# Patient Record
Sex: Female | Born: 1990 | Race: Black or African American | Hispanic: No | Marital: Single | Smoking: Never smoker
Health system: Southern US, Community
[De-identification: ages and names within clinical notes are randomized; demographics above are authoritative.]

## PROBLEM LIST (undated history)

## (undated) DIAGNOSIS — K3184 Gastroparesis: Secondary | ICD-10-CM

## (undated) DIAGNOSIS — R51 Headache: Secondary | ICD-10-CM

## (undated) DIAGNOSIS — K589 Irritable bowel syndrome without diarrhea: Secondary | ICD-10-CM

## (undated) DIAGNOSIS — R519 Headache, unspecified: Secondary | ICD-10-CM

## (undated) DIAGNOSIS — K219 Gastro-esophageal reflux disease without esophagitis: Secondary | ICD-10-CM

## (undated) DIAGNOSIS — F909 Attention-deficit hyperactivity disorder, unspecified type: Secondary | ICD-10-CM

## (undated) HISTORY — PX: MOUTH SURGERY: SHX715

## (undated) HISTORY — DX: Headache: R51

## (undated) HISTORY — DX: Headache, unspecified: R51.9

## (undated) HISTORY — DX: Irritable bowel syndrome, unspecified: K58.9

## (undated) HISTORY — DX: Gastro-esophageal reflux disease without esophagitis: K21.9

## (undated) HISTORY — DX: Gastroparesis: K31.84

## (undated) HISTORY — DX: Attention-deficit hyperactivity disorder, unspecified type: F90.9

---
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
## 2012-01-01 ENCOUNTER — Telehealth: Payer: Self-pay | Admitting: *Deleted

## 2012-01-01 NOTE — Telephone Encounter (Signed)
Results route to Sarah Lan, NP per Dr Jarold Motto.

## 2012-01-01 NOTE — Telephone Encounter (Signed)
Message copied by Florene Glen on Wed Jan 01, 2012  1:56 PM ------      Message from: Jarold Motto, DAVID R      Created: Mon Dec 30, 2011 11:37 AM       Mistake per ca 19-9...wrong test

## 2012-01-03 ENCOUNTER — Ambulatory Visit (AMBULATORY_SURGERY_CENTER): Payer: Medicaid Other | Admitting: Gastroenterology

## 2012-01-03 ENCOUNTER — Encounter: Payer: Self-pay | Admitting: Gastroenterology

## 2012-01-03 VITALS — BP 114/71 | HR 85 | Temp 97.5°F | Resp 20 | Ht 64.0 in | Wt 90.0 lb

## 2012-01-03 DIAGNOSIS — K210 Gastro-esophageal reflux disease with esophagitis: Secondary | ICD-10-CM

## 2012-01-03 DIAGNOSIS — D1330 Benign neoplasm of unspecified part of small intestine: Secondary | ICD-10-CM

## 2012-01-03 DIAGNOSIS — R1013 Epigastric pain: Secondary | ICD-10-CM

## 2012-01-03 MED ORDER — SODIUM CHLORIDE 0.9 % IV SOLN: 500.0000 mL | INTRAVENOUS | Status: DC

## 2012-01-03 NOTE — Patient Instructions (Addendum)
Discharge instructions given with verbal understanding. Biopsies taken. Resume previous medications. Office will arrange for a Gastric Emptying Scan. YOU HAD AN ENDOSCOPIC PROCEDURE TODAY AT THE [facility]: Refer to the procedure report that was given to you for any specific questions about what was found during the examination.  If the procedure report does not answer your questions, please call your gastroenterologist to clarify.  If you requested that your care partner not be given the details of your procedure findings, then the procedure report has been included in a sealed envelope for you to review at your convenience later.  YOU SHOULD EXPECT: Some feelings of bloating in the abdomen. Passage of more gas than usual.  Walking can help get rid of the air that was put into your GI tract during the procedure and reduce the bloating. If you had a lower endoscopy (such as a colonoscopy or flexible sigmoidoscopy) you may notice spotting of blood in your stool or on the toilet paper. If you underwent a bowel prep for your procedure, then you may not have a normal bowel movement for a few days.  DIET: Your first meal following the procedure should be a light meal and then it is ok to progress to your normal diet.  A half-sandwich or bowl of soup is an example of a good first meal.  Heavy or fried foods are harder to digest and may make you feel nauseous or bloated.  Likewise meals heavy in dairy and vegetables can cause extra gas to form and this can also increase the bloating.  Drink plenty of fluids but you should avoid alcoholic beverages for 24 hours.  ACTIVITY: Your care partner should take you home directly after the procedure.  You should plan to take it easy, moving slowly for the rest of the day.  You can resume normal activity the day after the procedure however you should NOT DRIVE or use heavy machinery for 24 hours (because of the sedation medicines used during the test).     SYMPTOMS TO REPORT IMMEDIATELY: A gastroenterologist can be reached at any hour.  During normal business hours, 8:30 AM to 5:00 PM Monday through Friday, call [phone].  After hours and on weekends, please call the GI answering service at [phone] who will take a message and have the physician on call contact you.   Following lower endoscopy (colonoscopy or flexible sigmoidoscopy):  Excessive amounts of blood in the stool  Significant tenderness or worsening of abdominal pains  Swelling of the abdomen that is new, acute  Fever of 100F or higher  FOLLOW UP: If any biopsies were taken you will be contacted by phone or by letter within the next 1-3 weeks.  Call your gastroenterologist if you have not heard about the biopsies in 3 weeks.  Our staff will call the home number listed on your records the next business day following your procedure to check on you and address any questions or concerns that you may have at that time regarding the information given to you following your procedure. This is a courtesy call and so if there is no answer at the home number and we have not heard from you through the emergency physician on call, we will assume that you have returned to your regular daily activities without incident.  SIGNATURES/CONFIDENTIALITY: You and/or your care partner have signed paperwork which will be entered into your electronic medical record.  These signatures attest to the fact that that the information above  on your After Visit Summary has been reviewed and is understood.  Full responsibility of the confidentiality of this discharge information lies with you and/or your care-partner.

## 2012-01-03 NOTE — Op Note (Signed)
[facility] [address] [place], [address]   ENDOSCOPY PROCEDURE REPORT  PATIENT: Sarah [name], Sarah [name]  MR#: [id number] BIRTHDATE: 09-05-1990 , 20  yrs. old GENDER: Female ENDOSCOPIST:David Hale Bogus, MD, [facility] REFERRED BY: PROCEDURE DATE:  01/03/2012 PROCEDURE:   EGD w/ biopsy and EGD w/ biopsy for H.pylori ASA CLASS:    Class I INDICATIONS: epigastric pain. MEDICATION: propofol (Diprivan) 200mg  IV TOPICAL ANESTHETIC:   Cetacaine Spray  DESCRIPTION OF PROCEDURE:   After the risks and benefits of the procedure were explained, informed consent was obtained.  The [facility] GIF-H180 [id number]  endoscope was introduced through the mouth  and advanced to the second portion of the duodenum .  The instrument was slowly withdrawn as the mucosa was fully examined.    The upper, middle and distal third of the esophagus were carefully inspected and no abnormalities were noted.  The z-line was well seen at the GEJ.  The endoscope was pushed into the fundus which was normal including a retroflexed view.  The antrum, gastric body, first and second part of the duodenum were unremarkable.   Duodenal and esophageal biopsies done.Noted is small FB and focal gastritis in body...see pictures.   Duodenal and esophageal biopsies done.Noted is small FB and focal gastritis in body...see pictures. Retroflexed views revealed no abnormalities.    The scope was then withdrawn from the patient and the procedure completed.  COMPLICATIONS: There were no complications.   ENDOSCOPIC IMPRESSION: 1.   Normal EGD 2.   Duodenal and esophageal biopsies done.Noted is small FB and focal gastritis in body...see pictures. 3.    ?? gastroparesis  RECOMMENDATIONS: 1.  Await pathology results 2.  My office will arrange for you to have a Gastric Emptying Scan performed.  This is a radiology test that gives an idea of how well your stomach functions.    _______________________________ eSignedMardella Layman, MD, [facility] 01/03/2012 3:47 PM      PATIENT NAME:  Sarah [name], Sarah [name] MR#: [id number]

## 2012-01-07 ENCOUNTER — Telehealth: Payer: Self-pay | Admitting: *Deleted

## 2012-01-07 NOTE — Telephone Encounter (Signed)
  Follow up Call-  Call back number 01/03/2012  Post procedure Call Back phone  # [phone]  Permission to leave phone message Yes     Patient questions:  Do you have a fever, pain , or abdominal swelling? no Pain Score  0 *  Have you tolerated food without any problems? yes  Have you been able to return to your normal activities? yes  Do you have any questions about your discharge instructions: Diet   no Medications  no Follow up visit  no  Do you have questions or concerns about your Care? no  Actions: * If pain score is 4 or above: No action needed, pain <4.

## 2012-01-10 ENCOUNTER — Telehealth: Payer: Self-pay | Admitting: *Deleted

## 2012-01-10 ENCOUNTER — Encounter: Payer: Self-pay | Admitting: Gastroenterology

## 2012-01-10 DIAGNOSIS — K3189 Other diseases of stomach and duodenum: Secondary | ICD-10-CM

## 2012-01-10 DIAGNOSIS — R1013 Epigastric pain: Secondary | ICD-10-CM

## 2012-01-10 NOTE — Telephone Encounter (Signed)
Pt informed of GES at [facility] on 01/21/12 at 0800am, arrive at 0745; NPO after midnight and she must stop her stomach meds 24 hours prior to the procedure. Pt stated understanding.

## 2012-01-21 ENCOUNTER — Encounter (HOSPITAL_COMMUNITY)
Admission: RE | Admit: 2012-01-21 | Discharge: 2012-01-21 | Disposition: A | Discharge status: Home / Self Care | Payer: Medicaid Other | Source: Ambulatory Visit | Attending: Gastroenterology | Admitting: Gastroenterology

## 2012-01-21 ENCOUNTER — Encounter (HOSPITAL_COMMUNITY): Payer: Self-pay

## 2012-01-21 DIAGNOSIS — R1013 Epigastric pain: Secondary | ICD-10-CM | POA: Insufficient documentation

## 2012-01-21 DIAGNOSIS — K3189 Other diseases of stomach and duodenum: Secondary | ICD-10-CM

## 2012-01-21 MED ORDER — TECHNETIUM TC 99M SULFUR COLLOID
2.2000 | Freq: Once | INTRAVENOUS | Status: AC | PRN
  Administered 2012-01-21: 2.2 via ORAL

## 2012-01-24 ENCOUNTER — Other Ambulatory Visit: Payer: Self-pay | Admitting: *Deleted

## 2012-01-24 MED ORDER — AMBULATORY NON FORMULARY MEDICATION: Status: DC

## 2012-01-31 ENCOUNTER — Telehealth: Payer: Self-pay | Admitting: Gastroenterology

## 2012-01-31 NOTE — Telephone Encounter (Signed)
I answered patient's questions about obtaining Domperidone at pharmacy in [place].

## 2012-03-05 ENCOUNTER — Encounter: Payer: Self-pay | Admitting: *Deleted

## 2012-03-06 ENCOUNTER — Ambulatory Visit: Payer: Self-pay

## 2012-03-10 ENCOUNTER — Ambulatory Visit: Payer: Medicaid Other | Admitting: Gastroenterology

## 2012-03-12 ENCOUNTER — Ambulatory Visit: Payer: Self-pay | Admitting: Family

## 2012-03-17 ENCOUNTER — Telehealth: Payer: Self-pay | Admitting: *Deleted

## 2012-03-17 NOTE — Telephone Encounter (Signed)
lmom for pt to call back

## 2012-03-17 NOTE — Telephone Encounter (Signed)
Message copied by Florene Glen on Tue Mar 17, 2012  3:37 PM ------      Message from: Florene Glen      Created: Fri Jan 24, 2012 10:49 AM       Needs f/u with dr Jarold Motto after beginning domperidone and gastroparesis diet

## 2012-03-27 ENCOUNTER — Ambulatory Visit: Payer: Self-pay | Admitting: Obstetrics & Gynecology

## 2012-04-09 ENCOUNTER — Ambulatory Visit (INDEPENDENT_AMBULATORY_CARE_PROVIDER_SITE_OTHER): Payer: Self-pay | Admitting: Obstetrics & Gynecology

## 2012-04-09 ENCOUNTER — Encounter: Payer: Self-pay | Admitting: Obstetrics & Gynecology

## 2012-04-09 VITALS — BP 102/73 | HR 102 | Temp 97.8°F | Resp 12 | Ht 63.0 in | Wt 97.9 lb

## 2012-04-09 DIAGNOSIS — Z3042 Encounter for surveillance of injectable contraceptive: Secondary | ICD-10-CM

## 2012-04-09 DIAGNOSIS — Z3049 Encounter for surveillance of other contraceptives: Secondary | ICD-10-CM

## 2012-04-09 MED ORDER — MEDROXYPROGESTERONE ACETATE 150 MG/ML IM SUSP
150.0000 mg | INTRAMUSCULAR | Status: DC
  Administered 2012-04-09: 150 mg via INTRAMUSCULAR

## 2012-04-09 NOTE — Patient Instructions (Signed)
Human Papillomavirus Vaccine, Quadrivalent What is this medicine? HUMAN PAPILLOMAVIRUS VACCINE (HYOO muhn pap uh LOH muh vahy ruhs vak SEEN) is a vaccine. It is used to prevent infections of four types of the human papillomavirus. In women, the vaccine may lower your risk of getting cervical, vaginal, or anal cancer and genital warts. In men, the vaccine may lower your risk of getting genital warts and anal cancer. You cannot get these diseases from the vaccine. This vaccine does not treat these diseases. This medicine may be used for other purposes; ask your health care provider or pharmacist if you have questions. What should I tell my health care provider before I take this medicine? They need to know if you have any of these conditions: -fever or infection -hemophilia -HIV infection or AIDS -immune system problems -low platelet count -an unusual reaction to Human Papillomavirus Vaccine, yeast, other medicines, foods, dyes, or preservatives -pregnant or trying to get pregnant -breast-feeding How should I use this medicine? This vaccine is for injection in a muscle on your upper arm or thigh. It is given by a health care professional. You will be observed for 15 minutes after each dose. Sometimes, fainting happens after the vaccine is given. You may be asked to sit or lie down during the 15 minutes. Three doses are given. The second dose is given 2 months after the first dose. The last dose is given 4 months after the second dose. A copy of a Vaccine Information Statement will be given before each vaccination. Read this sheet carefully each time. The sheet may change frequently. Talk to your pediatrician regarding the use of this medicine in children. While this drug may be prescribed for children as young as 9 years of age for selected conditions, precautions do apply. Overdosage: If you think you have taken too much of this medicine contact a poison control center or emergency room at  once. NOTE: This medicine is only for you. Do not share this medicine with others. What if I miss a dose? All 3 doses of the vaccine should be given within 6 months. Remember to keep appointments for follow-up doses. Your health care provider will tell you when to return for the next vaccine. Ask your health care professional for advice if you are unable to keep an appointment or miss a scheduled dose. What may interact with this medicine? -medicines that suppress your immune system like some medicines for cancer -steroid medicines like prednisone or cortisone -other vaccines This list may not describe all possible interactions. Give your health care provider a list of all the medicines, herbs, non-prescription drugs, or dietary supplements you use. Also tell them if you smoke, drink alcohol, or use illegal drugs. Some items may interact with your medicine. What should I watch for while using this medicine? This vaccine may not fully protect everyone. Continue to have regular pelvic exams and cervical or anal cancer screenings as directed by your doctor. The Human Papillomavirus is a sexually transmitted disease. It can be passed by any kind of sexual activity that involves genital contact. The vaccine works best when given before you have any contact with the virus. Many people who have the virus do not have any signs or symptoms. Tell your doctor or health care professional if you have any reaction or unusual symptom after getting the vaccine. What side effects may I notice from receiving this medicine? Side effects that you should report to your doctor or health care professional as soon as possible: -  allergic reactions like skin rash, itching or hives, swelling of the face, lips, or tongue -breathing problems -feeling faint or lightheaded, falls Side effects that usually do not require medical attention (report to your doctor or health care professional if they continue or are  bothersome): -cough -fever -redness, warmth, swelling, pain, or itching at site where injected This list may not describe all possible side effects. Call your doctor for medical advice about side effects. You may report side effects to FDA at 1-800-FDA-1088. Where should I keep my medicine? This drug is given in a hospital or clinic and will not be stored at home. NOTE: This sheet is a summary. It may not cover all possible information. If you have questions about this medicine, talk to your doctor, pharmacist, or health care provider.  2013, Elsevier/Gold Standard. (04/27/2009 11:52:23 AM)  

## 2012-04-09 NOTE — Progress Notes (Signed)
Patient here for Depo Provera contraception. Satisfied with method. Uses condoms for STI prevention.  Recommended HPV vaccine, patient will consider this.  Patient takes Calcium and Vitamin D supplements as she has been on Depo Provera for over a year.  No other GYN concerns.  Will receive Depo Provera today, return in 3 months.  Total encounter time: 10 minutes

## 2012-04-13 NOTE — Telephone Encounter (Signed)
Pt never called back. Made her an appt and mailed her a letter informing her.

## 2012-05-08 ENCOUNTER — Ambulatory Visit: Payer: Self-pay | Admitting: Gastroenterology

## 2012-05-12 ENCOUNTER — Ambulatory Visit: Payer: Self-pay | Admitting: Gastroenterology

## 2012-05-26 ENCOUNTER — Encounter: Payer: Self-pay | Admitting: Gastroenterology

## 2012-05-26 ENCOUNTER — Ambulatory Visit (INDEPENDENT_AMBULATORY_CARE_PROVIDER_SITE_OTHER): Payer: Self-pay | Admitting: Gastroenterology

## 2012-05-26 VITALS — BP 100/70 | HR 88 | Ht 63.0 in | Wt 97.4 lb

## 2012-05-26 DIAGNOSIS — K589 Irritable bowel syndrome without diarrhea: Secondary | ICD-10-CM

## 2012-05-26 DIAGNOSIS — E739 Lactose intolerance, unspecified: Secondary | ICD-10-CM

## 2012-05-26 MED ORDER — HYOSCYAMINE SULFATE 0.125 MG SL SUBL: 0.1250 mg | SUBLINGUAL_TABLET | SUBLINGUAL | Status: DC | PRN

## 2012-05-26 NOTE — Progress Notes (Signed)
This is a -year-old [place] American female with chronic IBS previously evaluated by Dr. Chestine Spore.  She's had upper GI small bowel series, upper abdominal ultrasound exam, and can continues with crampy abdominal pain postprandially.  Gastric emptying scan suggested possible mild gastroparesis, but the patient could not afford domperidone.  She developed urticaria with PPI therapy, and uses when necessary Zantac.  Her mother is with her today throughout her interview and exam.  Patient has ADD and is on Adderall XL several times a day complicating her therapy.  Is been no anorexia, weight loss, melena, hematochezia, hepatobiliary complaints.  She does have known lactose intolerance.  Current Medications, Allergies, Past Medical History, Past Surgical History, Family History and Social History were reviewed in [employer] record.  ROS: All systems were reviewed and are negative unless otherwise stated in the HPI.          Physical Exam:patient in no distress appears stated age.  Blood pressure 170, pulse 88, and BMI 17.25 with weight of 97 pounds.  I cannot appreciate stigmata of chronic liver disease.  Her abdomen is not distended and there is no organomegaly, masses or tenderness.  Bowel sounds are normal, and there is no succussion splash.    Assessment and Plan:lactose intolerance with IBS probably exacerbated by Adderall long-term use.  I placed her on when necessary Levsin sublingually, ask her mother to give her Zantac on a 50 mg at bedtime a regular basis, and we will follow her when necessary.  I've given her a copy of our lactose-free diet to follow.  She may need referral to dietary therapy.  She continues to have problems we'll consider endoscopy with gastric and small bowel biopsies.  Workup today showed no evidence of celiac disease or other identifiable abnormalities. No diagnosis found.

## 2012-05-26 NOTE — Patient Instructions (Addendum)
Information on Lactose Diet listed below.  Please take Zantac 150 mg at bedtime.   We have sent the following medications to your pharmacy for you to pick up at your convenience: Levsin, please take as needed.   Lactose-Free Diet Lactose is a carbohydrate that is found mainly in milk and milk products, as well as in foods with added milk or whey. Lactose must be digested by the enzyme lactase in order to be used by the body. Lactose intolerance occurs when there is a shortage of lactase. When your body is not able to digest lactose, you may feel sick to your stomach (nausea), bloated, and have cramps, gas, and diarrhea. TYPES OF LACTASE DEFICIENCY  Primary lactase deficiency. This is the most common type. It is characterized by a slow decrease in lactase activity.  Secondary lactase deficiency. This occurs following injury to the small intestinal mucosa as a result of a disease or condition. It can also occur as a result of surgery or after treatment with antibiotic medicines or cancer drugs. Tolerance to lactose varies widely. Each person must determine how much milk can be consumed without developing symptoms. Drinking smaller portions of milk throughout the day may be helpful. Some studies suggest that slowing gastric emptying may help increase tolerance of milk products. This may be done by:  Consuming milk or milk products with a meal rather than alone.  Consuming milk with a higher fat content. There are many dairy products that may be tolerated better than milk by some people, including:  Cheese (especially aged cheese). The lactose content is much lower than in milk.  Cultured dairy products, such as yogurt, buttermilk, cottage cheese, and sweet acidophilus milk (kefir). These products are usually well tolerated by lactase-deficient people. This is because the healthy bacteria help digest lactose.  Lactose-hydrolyzed milk. This product contains 40% to 90% less lactose than milk and  may also be well tolerated. ADEQUACY These diets may be deficient in calcium, riboflavin, and vitamin D, according to the Recommended Dietary Allowances of the [employer]. Depending on individual tolerances and the use of milk substitutes, milk, or other dairy products, you may be able to meet these recommendations. SPECIAL NOTES  Lactose is a carbohydrate. The main food source for lactose is dairy products. Reading food labels is important. Many products contain lactose even when they are not made from milk. Look for the following words: whey, milk solids, dry milk solids, nonfat dry milk powder. Typical sources of lactose other than dairy products include breads, candies, cold cuts, prepared and processed foods, and commercial sauces and gravies.  All foods must be prepared without milk, cream, or other dairy foods.  A vitamin or mineral supplement may be necessary. Consult your caregiver or Registered Dietitian.  Lactose is also found in many prescription and over-the-counter medicines.  Soy milk and lactose-free supplements may be used as an alternative to milk. CHOOSING FOODS Breads and Starches  Allowed: Breads and rolls made without milk. [place], [place], or [place] bread. Soda crackers, graham crackers. Any crackers prepared without lactose. Cooked or dry cereals prepared without lactose (read labels). Any potatoes, pasta, or rice prepared without milk or lactose. Popcorn.  Avoid: Breads and rolls that contain milk. Prepared mixes such as muffins, biscuits, waffles, pancakes. Sweet rolls, donuts, [place] toast (if made with milk or lactose). Zwieback crackers, corn curls, or any crackers that contain lactose. Cooked or dry cereals prepared with lactose (read labels). Instant potatoes, frozen [place] fries, scalloped or au gratin  potatoes. Vegetables  Allowed: Fresh, frozen, and canned vegetables.  Avoid: Creamed or breaded vegetables. Vegetables in a cheese sauce or with  lactose-containing margarines. Fruit  Allowed: All fresh, canned, or frozen fruits that are not processed with lactose.  Avoid: Any canned or frozen fruits processed with lactose. Meat and Meat Substitutes  Allowed: Plain beef, chicken, fish, [place], lamb, veal, pork, or ham. Kosher prepared meat products. Strained or junior meats that do not contain milk. Eggs, soy meat substitutes, nuts.  Avoid: Scrambled eggs, omelets, and souffles that contain milk. Creamed or breaded meat, fish, or fowl. Sausage products such as wieners, liver sausage, or cold cuts that contain milk solids. Cheese, cottage cheese, or cheese spreads. Milk  Allowed: None.  Avoid: Milk (whole, 2%, skim, or chocolate). Evaporated, powdered, or condensed milk. Malted milk. Soups and Combination Foods  Allowed: Bouillon, broth, vegetable soups, clear soups, consomms. Homemade soups made with allowed ingredients. Combination or prepared foods that do not contain milk or milk products (read labels).  Avoid: Cream soups, chowders, commercially prepared soups containing lactose. Macaroni and cheese, pizza. Combination or prepared foods that contain milk or milk products. Desserts and Sweets  Allowed: Water and fruit ices, gelatin, angel food cake. Homemade cookies, pies, or cakes made from allowed ingredients. Pudding (if made with water or a milk substitute). Lactose-free tofu desserts. Sugar, honey, corn syrup, jam, jelly, marmalade, molasses (beet sugar). Pure sugar candy, marshmallows.  Avoid: Ice cream, ice milk, sherbet, custard, pudding, frozen yogurt. Commercial cake and cookie mixes. Desserts that contain chocolate. Pie crust made with milk-containing margarine. Reduced calorie desserts made with a sugar substitute that contains lactose. Toffee, peppermint, butterscotch, chocolate, caramels. Fats and Oils  Allowed: Butter (as tolerated, contains very small amounts of lactose). Margarines and dressings that do not  contain milk. Vegetable oils, shortening, mayonnaise, nondairy cream and whipped toppings without lactose or milk solids added. Tomasa Blase.  Avoid: Margarines and salad dressings containing milk. Cream, cream cheese, peanut butter with added milk solids, sour cream, chip dips made with sour cream. Beverages  Allowed: Carbonated drinks, tea, coffee and freeze-dried coffee, some instant coffees (check labels). Fruit drinks, fruit and vegetable juice, rice or soy milk.  Avoid: Hot chocolate. Some cocoas, some instant coffees, instant iced teas, powdered fruit drinks (read labels). Condiments  Allowed: Soy sauce, carob powder, olives, gravy made with water, baker's cocoa, pickles, pure seasonings and spices, wine, pure monosodium glutamate, catsup, mustard.  Avoid: Some chewing gums, chocolate, some cocoas. Certain antibiotics and vitamin or mineral preparations. Spice blends if they contain milk products. MSG extender. Artificial sweeteners that contain lactose. Some nondairy creamers (read labels). SAMPLE MENU Breakfast  Orange juice.  Banana.  Bran cereal.  Nondairy creamer.  Vienna bread, toasted.  Butter or milk-free margarine.  Coffee or tea. Lunch  Chicken breast.  Rice.  Green beans.  Butter or milk-free margarine.  Fresh melon.  Coffee or tea. Dinner  [place].  Baked potato.  Butter or milk-free margarine.  Broccoli.  Lettuce salad with vinegar and oil dressing.  [employer].  Coffee or tea. Document Released: 10/12/2001 Document Revised: 07/15/2011 Document Reviewed: 07/20/2010 [facility] Patient Information 2013 [place], [place].

## 2012-06-25 ENCOUNTER — Ambulatory Visit: Payer: Self-pay

## 2012-07-17 ENCOUNTER — Ambulatory Visit: Payer: Self-pay

## 2012-08-11 ENCOUNTER — Ambulatory Visit (INDEPENDENT_AMBULATORY_CARE_PROVIDER_SITE_OTHER): Payer: Self-pay | Admitting: Internal Medicine

## 2012-08-11 ENCOUNTER — Ambulatory Visit: Payer: Self-pay

## 2012-08-11 ENCOUNTER — Encounter: Payer: Self-pay | Admitting: Internal Medicine

## 2012-08-11 VITALS — BP 104/69 | HR 91 | Temp 99.7°F | Ht 63.0 in | Wt 96.5 lb

## 2012-08-11 DIAGNOSIS — M359 Systemic involvement of connective tissue, unspecified: Secondary | ICD-10-CM | POA: Insufficient documentation

## 2012-08-11 DIAGNOSIS — K589 Irritable bowel syndrome without diarrhea: Secondary | ICD-10-CM

## 2012-08-11 DIAGNOSIS — K219 Gastro-esophageal reflux disease without esophagitis: Secondary | ICD-10-CM

## 2012-08-11 DIAGNOSIS — J329 Chronic sinusitis, unspecified: Secondary | ICD-10-CM

## 2012-08-11 DIAGNOSIS — F909 Attention-deficit hyperactivity disorder, unspecified type: Secondary | ICD-10-CM | POA: Insufficient documentation

## 2012-08-11 DIAGNOSIS — K3184 Gastroparesis: Secondary | ICD-10-CM

## 2012-08-11 LAB — COMPLETE METABOLIC PANEL WITH GFR
ALT: 11 U/L (ref 0–35)
Alkaline Phosphatase: 52 U/L (ref 39–117)
CO2: 28 mEq/L (ref 19–32)
Creat: 0.81 mg/dL (ref 0.50–1.10)
GFR, Est African American: >89 mL/min
GFR, Est Non African American: >89 mL/min
Total Bilirubin: 0.4 mg/dL (ref 0.3–1.2)

## 2012-08-11 MED ORDER — CETIRIZINE HCL 10 MG PO TABS: 10.0000 mg | ORAL_TABLET | Freq: Every day | ORAL | Status: DC

## 2012-08-11 MED ORDER — AMPHETAMINE-DEXTROAMPHET ER 5 MG PO CP24: ORAL_CAPSULE | ORAL | Status: DC

## 2012-08-11 MED ORDER — SALINE NASAL SPRAY 0.65 % NA SOLN: 1.0000 | NASAL | Status: DC | PRN

## 2012-08-11 MED ORDER — FLUTICASONE PROPIONATE 50 MCG/ACT NA SUSP: 1.0000 | Freq: Every day | NASAL | Status: DC

## 2012-08-11 NOTE — Assessment & Plan Note (Signed)
She meets 3/11 criteria for diagnosis of systemic lupus erythematosus including positive ANA, leukopenia, and questionable migratory arthritis.  - We will repeat her CBC with differential, CMP, and UA.  If UA shows protein, will obtain 24-hour urine protein and creatinine clearance.  If she has proteinuria more than 500 mg/dL, her proteinuria could be one criteria. - May also need to check her anti--DS-DNA, anti-SM or antiphospholipid antibodies if her UA is negative for protein urea  Her 3/11 criteria and positive family history of SLE put her at a higher pretest probability for lupus.  We will go ahead to arrange for orange card application for possile further diagnostic tests and potentially expensive treatment if her Lupus is confirmed and nephritis/vasculitis/Seroritis are involved.  - Above discussion communicated with patient and her mother in the room.  All questions answered.

## 2012-08-11 NOTE — Assessment & Plan Note (Addendum)
  She states that patient used to see a psychiatrist and lost follow up due to financial strain.   - I discussed the importance of psychiatry follow up.  - will need to send referral once she receives her orange card

## 2012-08-11 NOTE — Progress Notes (Signed)
Subjective:   Patient ID: Sarah [name] female   DOB: 10-03-1990 22 y.o.   MRN: [id number]  HPI: [name] is a 22 y.o. African American young woman with PMH of GERD, ADHD, IBS and gastroparesis who presents to the clinic to establish care.  # Possible autoimmune disorder 1. GI--gastroparesis She was noted to have several years of recurrent epigastric abdominal pain with subsequent GI work up including upper GI small bowel series, upper abd U/S and gastric emptying scan, which suggested possible mild gastroparesis, but the patient could not afford domperidone. She developed urticaria with PPI therapy, and uses when necessary Zantac.  2. 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 Onalee Hua Patterson's document on 12/27/11.  Patient described that her fingers and toes would turn white and hurt with cold environment.   3. Hematologic--Leukopenia 4. Serology-- positive ANA, speckled, titer 1: 1280 5. Arthritis pain- she reports that she has had intermittent multiple joints pain mainly involved her knees and back.  No s/s arthritic pain in her small joints.  6. Positive family history-mother has confirmed Lupus and aunt may have Lupus as well.   Denies hx of rash, photosensitivity, oral/nasopharyngeal ulcers.  # sinusitis Patient states that she has had cold-like symptoms 3 weeks ago, including headaches, sneezing, nasal discharge, postnasal drip, moderate yellowish productive cough.  Her mother and her coworkers were also sick at the same time.  She said her symptoms are largely resolved now except for some sinus congestion/heaviness, postnasal drip and nonproductive cough in the morning.  Denies fever, chills, chest pain, chest pressure, shortness breath, nausea, vomiting, abdomen pain or weakness.  Denies night sweats.    Past Medical History  Diagnosis Date  . GERD (gastroesophageal reflux disease) 2010  . ADHD (attention deficit hyperactivity disorder)   .  Bilateral headaches   . IBS (irritable bowel syndrome)   . Gastroparesis    Current Outpatient Prescriptions  Medication Sig Dispense Refill  . AMBULATORY NON FORMULARY MEDICATION Medication Name: Domperidone 10 mg by mouth 3 times daily before meals for Gastroparesis  240 tablet  6  . amphetamine-dextroamphetamine (ADDERALL XR) 5 MG 24 hr capsule 2 capsules by mouth in the AM with meal when working, 1 capsule other days as needed  60 capsule  0  . cetirizine (ZYRTEC) 10 MG tablet Take 1 tablet (10 mg total) by mouth daily.  30 tablet  2  . fluticasone (FLONASE) 50 MCG/ACT nasal spray Place 2 sprays into the nose daily as needed.      . fluticasone (FLONASE) 50 MCG/ACT nasal spray Place 1 spray into the nose daily.  16 g  2  . hyoscyamine (LEVSIN SL) 0.125 MG SL tablet Place 0.125 mg under the tongue every 6 (six) hours as needed.      . medroxyPROGESTERone (DEPO-PROVERA) 150 MG/ML injection Inject 150 mg into the muscle every 3 (three) months.      . methocarbamol (ROBAXIN) 750 MG tablet Take 1 tablet by mouth twice a day as needed for headache spasms      . Probiotic Product (ALIGN) 4 MG CAPS Take 1 capsule by mouth daily.      . ranitidine (ZANTAC) 150 MG tablet Take 150 mg by mouth. One tablet daily at bedtime      . sodium chloride (OCEAN NASAL SPRAY) 0.65 % nasal spray Place 1 spray into the nose as needed for congestion.  45 mL  3   No current facility-administered medications for this visit.   Family  History  Problem Relation Age of Onset  . Liver cancer Paternal Grandfather   . Alcohol abuse Paternal Grandfather   . Breast cancer Paternal Grandmother 50    deceased  . Mental illness Father   . Graves' disease Mother   . Lupus Mother   . Arthritis Mother   . Graves' disease Maternal Grandmother   . Stomach cancer Maternal Grandfather 72    deceased  . Colon cancer Neg Hx    History   Social History  . Marital Status: Single    Spouse Name: N/A    Number of Children: 0   . Years of Education: N/A   Occupational History  . [employer]    Social History Main Topics  . Smoking status: Never Smoker   . Smokeless tobacco: Never Used  . Alcohol Use: No  . Drug Use: No  . Sexually Active: Not Currently    Birth Control/ Protection: Injection   Other Topics Concern  . None   Social History Narrative   No caffeine    Review of Systems:.   Negative 11 points of review of systems except for above documentation Objective:  Physical Exam: Filed Vitals:   08/11/12 1543  BP: 104/69  Pulse: 91  Temp: 99.7 F (37.6 C)  TempSrc: Oral  Height: 5\' 3"  (1.6 m)  Weight: 96 lb 8 oz (43.772 kg)  SpO2: 99%   General: alert, well-developed, and cooperative to examination.  HEENT: Erythematous nasal mucosa with no drainage, no oral exudate noted, tympanic membrane unremarkable. mild tenderness to palpation of maxillary/ frontal sinuses  Lungs: normal respiratory effort, no accessory muscle use, normal breath sounds, no crackles, and no wheezes. Heart: normal rate, regular rhythm, no murmur, no gallop, and no rub.  Abdomen: soft, non-tender, normal bowel sounds, no distention, no guarding, no rebound tenderness, no hepatomegaly, and no splenomegaly.  Msk: no joint swelling, no joint warmth, and no redness over joints.  No joint deformity  Pulses: 2+ DP/PT pulses bilaterally Extremities: No cyanosis, clubbing, edema Neurologic: alert & oriented X3, cranial nerves II-XII intact, strength normal in all extremities, sensation intact to light touch, and gait normal.  Skin: turgor normal and no rashes.  Psych: Oriented X3, memory intact for recent and remote, normally interactive, good eye contact, not anxious appearing, and not depressed appearing.    Assessment & Plan:

## 2012-08-11 NOTE — Assessment & Plan Note (Signed)
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  I do not think she needs antibiotics given her current symptoms.  I think she does need decongestant medications to help her clear up her sinus congestion.  - I will give her nasal saline spray, intranasal glucocorticoid Flonase, and antihistamine Zyrtec treatment, -Patient is instructed to followup in one to 2 weeks if no improvement. -Patient is instructed to come to the clinic if she develops fever, chills or worsening of her symptoms.

## 2012-08-11 NOTE — Patient Instructions (Addendum)
1. Will give you saline spray, zyrtec and Flonase for your sinusitis. 2. Follow up in 1-2 weeks if no improvement  3. Call the clinic if you have worsening of symptoms like yellow or green sputum, fever, chills.  Sinusitis Sinusitis is redness, soreness, and swelling (inflammation) of the paranasal sinuses. Paranasal sinuses are air pockets within the bones of your face (beneath the eyes, the middle of the forehead, or above the eyes). In healthy paranasal sinuses, mucus is able to drain out, and air is able to circulate through them by way of your nose. However, when your paranasal sinuses are inflamed, mucus and air can become trapped. This can allow bacteria and other germs to grow and cause infection. Sinusitis can develop quickly and last only a short time (acute) or continue over a long period (chronic). Sinusitis that lasts for more than 12 weeks is considered chronic.  CAUSES  Causes of sinusitis include:  Allergies.  Structural abnormalities, such as displacement of the cartilage that separates your nostrils (deviated septum), which can decrease the air flow through your nose and sinuses and affect sinus drainage.  Functional abnormalities, such as when the small hairs (cilia) that line your sinuses and help remove mucus do not work properly or are not present. SYMPTOMS  Symptoms of acute and chronic sinusitis are the same. The primary symptoms are pain and pressure around the affected sinuses. Other symptoms include:  Upper toothache.  Earache.  Headache.  Bad breath.  Decreased sense of smell and taste.  A cough, which worsens when you are lying flat.  Fatigue.  Fever.  Thick drainage from your nose, which often is green and may contain pus (purulent).  Swelling and warmth over the affected sinuses. DIAGNOSIS  Your caregiver will perform a physical exam. During the exam, your caregiver may:  Look in your nose for signs of abnormal growths in your nostrils (nasal  polyps).  Tap over the affected sinus to check for signs of infection.  View the inside of your sinuses (endoscopy) with a special imaging device with a light attached (endoscope), which is inserted into your sinuses. If your caregiver suspects that you have chronic sinusitis, one or more of the following tests may be recommended:  Allergy tests.  Nasal culture A sample of mucus is taken from your nose and sent to a lab and screened for bacteria.  Nasal cytology A sample of mucus is taken from your nose and examined by your caregiver to determine if your sinusitis is related to an allergy. TREATMENT  Most cases of acute sinusitis are related to a viral infection and will resolve on their own within 10 days. Sometimes medicines are prescribed to help relieve symptoms (pain medicine, decongestants, nasal steroid sprays, or saline sprays).  However, for sinusitis related to a bacterial infection, your caregiver will prescribe antibiotic medicines. These are medicines that will help kill the bacteria causing the infection.  Rarely, sinusitis is caused by a fungal infection. In theses cases, your caregiver will prescribe antifungal medicine. For some cases of chronic sinusitis, surgery is needed. Generally, these are cases in which sinusitis recurs more than 3 times per year, despite other treatments. HOME CARE INSTRUCTIONS   Drink plenty of water. Water helps thin the mucus so your sinuses can drain more easily.  Use a humidifier.  Inhale steam 3 to 4 times a day (for example, sit in the bathroom with the shower running).  Apply a warm, moist washcloth to your face 3 to 4 times  a day, or as directed by your caregiver.  Use saline nasal sprays to help moisten and clean your sinuses.  Take over-the-counter or prescription medicines for pain, discomfort, or fever only as directed by your caregiver. SEEK IMMEDIATE MEDICAL CARE IF:  You have increasing pain or severe headaches.  You have  nausea, vomiting, or drowsiness.  You have swelling around your face.  You have vision problems.  You have a stiff neck.  You have difficulty breathing. MAKE SURE YOU:   Understand these instructions.  Will watch your condition.  Will get help right away if you are not doing well or get worse. Document Released: 04/22/2005 Document Revised: 07/15/2011 Document Reviewed: 05/07/2011 [facility] Patient Information 2013 [place], [place].

## 2012-08-12 LAB — CBC WITH DIFFERENTIAL/PLATELET
Basophils Relative: 1 % (ref 0–1)
Eosinophils Absolute: 0 10*3/uL (ref 0.0–0.7)
Eosinophils Relative: 2 % (ref 0–5)
Lymphs Abs: 1.1 10*3/uL (ref 0.7–4.0)
MCH: 30.1 pg (ref 26.0–34.0)
MCHC: 34.1 g/dL (ref 30.0–36.0)
MCV: 88.4 fL (ref 78.0–100.0)
Neutrophils Relative %: 22 % — ABNORMAL LOW (ref 43–77)
Platelets: 213 10*3/uL (ref 150–400)
RDW: 12.8 % (ref 11.5–15.5)

## 2012-08-12 LAB — URINALYSIS, ROUTINE W REFLEX MICROSCOPIC
Bilirubin Urine: NEGATIVE
Nitrite: NEGATIVE
Specific Gravity, Urine: 1.03 (ref 1.005–1.030)
Urobilinogen, UA: 1 mg/dL (ref 0.0–1.0)
pH: 6 (ref 5.0–8.0)

## 2012-08-13 ENCOUNTER — Ambulatory Visit: Payer: Medicaid Other

## 2012-08-13 ENCOUNTER — Ambulatory Visit: Payer: Self-pay

## 2012-08-13 ENCOUNTER — Ambulatory Visit (INDEPENDENT_AMBULATORY_CARE_PROVIDER_SITE_OTHER): Payer: Medicaid Other

## 2012-08-13 DIAGNOSIS — Z3049 Encounter for surveillance of other contraceptives: Secondary | ICD-10-CM

## 2012-08-13 DIAGNOSIS — Z3042 Encounter for surveillance of injectable contraceptive: Secondary | ICD-10-CM

## 2012-08-13 LAB — POCT PREGNANCY, URINE
Preg Test, Ur: NEGATIVE
Preg Test, Ur: NEGATIVE

## 2012-08-13 MED ORDER — MEDROXYPROGESTERONE ACETATE 150 MG/ML IM SUSP
150.0000 mg | Freq: Once | INTRAMUSCULAR | Status: AC
  Administered 2012-08-13: 150 mg via INTRAMUSCULAR

## 2012-10-29 ENCOUNTER — Ambulatory Visit: Payer: Medicaid Other

## 2012-11-09 ENCOUNTER — Emergency Department (HOSPITAL_COMMUNITY): Payer: Self-pay

## 2012-11-09 ENCOUNTER — Encounter (HOSPITAL_COMMUNITY): Payer: Self-pay | Admitting: Emergency Medicine

## 2012-11-09 ENCOUNTER — Emergency Department (HOSPITAL_COMMUNITY)
Admission: EM | Admit: 2012-11-09 | Discharge: 2012-11-09 | Disposition: A | Discharge status: Home / Self Care | Payer: Self-pay | Attending: Emergency Medicine | Admitting: Emergency Medicine

## 2012-11-09 DIAGNOSIS — S139XXA Sprain of joints and ligaments of unspecified parts of neck, initial encounter: Secondary | ICD-10-CM | POA: Insufficient documentation

## 2012-11-09 DIAGNOSIS — S1093XA Contusion of unspecified part of neck, initial encounter: Secondary | ICD-10-CM | POA: Insufficient documentation

## 2012-11-09 DIAGNOSIS — S0083XA Contusion of other part of head, initial encounter: Secondary | ICD-10-CM

## 2012-11-09 DIAGNOSIS — S161XXA Strain of muscle, fascia and tendon at neck level, initial encounter: Secondary | ICD-10-CM

## 2012-11-09 DIAGNOSIS — S0003XA Contusion of scalp, initial encounter: Secondary | ICD-10-CM | POA: Insufficient documentation

## 2012-11-09 DIAGNOSIS — F909 Attention-deficit hyperactivity disorder, unspecified type: Secondary | ICD-10-CM | POA: Insufficient documentation

## 2012-11-09 DIAGNOSIS — Z8719 Personal history of other diseases of the digestive system: Secondary | ICD-10-CM | POA: Insufficient documentation

## 2012-11-09 DIAGNOSIS — IMO0002 Reserved for concepts with insufficient information to code with codable children: Secondary | ICD-10-CM | POA: Insufficient documentation

## 2012-11-09 MED ORDER — METHOCARBAMOL 500 MG PO TABS: 500.0000 mg | ORAL_TABLET | Freq: Two times a day (BID) | ORAL | Status: AC

## 2012-11-09 MED ORDER — IBUPROFEN 800 MG PO TABS: 800.0000 mg | ORAL_TABLET | Freq: Three times a day (TID) | ORAL | Status: AC
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
## 2012-11-10 NOTE — ED Provider Notes (Signed)
I was available for the completion of this patient's care.  Please see the initial physician's note.  Gerhard Munch, MD 11/10/12 [phone]

## 2012-11-10 NOTE — ED Provider Notes (Signed)
Medical screening examination/treatment/procedure(s) were performed by non-physician practitioner and as supervising physician I was immediately available for consultation/collaboration.  Toy Baker, MD 11/10/12 [phone]

## 2012-11-19 ENCOUNTER — Ambulatory Visit (INDEPENDENT_AMBULATORY_CARE_PROVIDER_SITE_OTHER): Payer: Medicaid Other | Admitting: Obstetrics and Gynecology

## 2012-11-19 VITALS — BP 95/80 | HR 88 | Wt 91.0 lb

## 2012-11-19 DIAGNOSIS — Z309 Encounter for contraceptive management, unspecified: Secondary | ICD-10-CM

## 2012-11-19 DIAGNOSIS — Z3049 Encounter for surveillance of other contraceptives: Secondary | ICD-10-CM

## 2012-11-19 MED ORDER — MEDROXYPROGESTERONE ACETATE 150 MG/ML IM SUSP
150.0000 mg | Freq: Once | INTRAMUSCULAR | Status: AC
  Administered 2012-11-19: 150 mg via INTRAMUSCULAR

## 2013-02-04 ENCOUNTER — Ambulatory Visit: Payer: Medicaid Other

## 2013-02-10 ENCOUNTER — Ambulatory Visit: Payer: Medicaid Other

## 2013-04-21 ENCOUNTER — Ambulatory Visit: Payer: Medicaid Other

## 2013-04-23 ENCOUNTER — Ambulatory Visit (INDEPENDENT_AMBULATORY_CARE_PROVIDER_SITE_OTHER): Payer: Medicaid Other | Admitting: General Practice

## 2013-04-23 VITALS — BP 115/73 | HR 86 | Temp 97.6°F | Ht 63.0 in | Wt 100.3 lb

## 2013-04-23 DIAGNOSIS — Z3049 Encounter for surveillance of other contraceptives: Secondary | ICD-10-CM

## 2013-04-23 MED ORDER — MEDROXYPROGESTERONE ACETATE 104 MG/0.65ML ~~LOC~~ SUSP
104.0000 mg | Freq: Once | SUBCUTANEOUS | Status: AC
  Administered 2013-04-23: 104 mg via SUBCUTANEOUS

## 2013-04-26 ENCOUNTER — Ambulatory Visit: Payer: Medicaid Other

## 2013-07-16 ENCOUNTER — Ambulatory Visit: Payer: Medicaid Other

## 2013-08-11 ENCOUNTER — Ambulatory Visit (INDEPENDENT_AMBULATORY_CARE_PROVIDER_SITE_OTHER): Payer: Medicaid Other | Admitting: General Practice

## 2013-08-11 VITALS — BP 112/73 | HR 90 | Temp 98.4°F | Ht 63.0 in | Wt 99.2 lb

## 2013-08-11 DIAGNOSIS — Z309 Encounter for contraceptive management, unspecified: Secondary | ICD-10-CM

## 2013-08-11 DIAGNOSIS — Z3049 Encounter for surveillance of other contraceptives: Secondary | ICD-10-CM

## 2013-08-11 DIAGNOSIS — Z01812 Encounter for preprocedural laboratory examination: Secondary | ICD-10-CM

## 2013-08-11 DIAGNOSIS — IMO0001 Reserved for inherently not codable concepts without codable children: Secondary | ICD-10-CM

## 2013-08-11 LAB — POCT PREGNANCY, URINE: Preg Test, Ur: NEGATIVE

## 2013-08-11 MED ORDER — MEDROXYPROGESTERONE ACETATE 104 MG/0.65ML ~~LOC~~ SUSP
104.0000 mg | Freq: Once | SUBCUTANEOUS | Status: AC
  Administered 2013-08-11: 104 mg via SUBCUTANEOUS

## 2013-08-11 NOTE — Progress Notes (Signed)
Patient here for depo and is 12 days late for injection- will obtain pregnancy test today. UPT negative

## 2013-11-02 ENCOUNTER — Ambulatory Visit: Payer: Medicaid Other

## 2013-11-26 ENCOUNTER — Ambulatory Visit (INDEPENDENT_AMBULATORY_CARE_PROVIDER_SITE_OTHER): Payer: Medicaid Other | Admitting: *Deleted

## 2013-11-26 VITALS — BP 105/81 | HR 67 | Temp 98.4°F | Wt 94.0 lb

## 2013-11-26 DIAGNOSIS — Z30013 Encounter for initial prescription of injectable contraceptive: Secondary | ICD-10-CM

## 2013-11-26 DIAGNOSIS — Z3009 Encounter for other general counseling and advice on contraception: Secondary | ICD-10-CM

## 2013-11-26 LAB — POCT PREGNANCY, URINE: Preg Test, Ur: NEGATIVE

## 2013-11-26 MED ORDER — MEDROXYPROGESTERONE ACETATE 104 MG/0.65ML ~~LOC~~ SUSP: 104.0000 mg | Freq: Once | SUBCUTANEOUS | Status: DC

## 2014-02-18 ENCOUNTER — Ambulatory Visit: Payer: Medicaid Other

## 2014-07-07 ENCOUNTER — Ambulatory Visit: Payer: Medicaid Other

## 2015-08-29 ENCOUNTER — Ambulatory Visit: Payer: Medicaid Other

## 2015-08-30 ENCOUNTER — Ambulatory Visit (INDEPENDENT_AMBULATORY_CARE_PROVIDER_SITE_OTHER): Payer: Medicaid Other

## 2015-08-30 ENCOUNTER — Ambulatory Visit: Payer: Medicaid Other

## 2015-08-30 DIAGNOSIS — Z3042 Encounter for surveillance of injectable contraceptive: Secondary | ICD-10-CM

## 2015-08-30 DIAGNOSIS — Z309 Encounter for contraceptive management, unspecified: Secondary | ICD-10-CM

## 2015-08-30 LAB — POCT PREGNANCY, URINE: PREG TEST UR: NEGATIVE

## 2015-08-30 MED ORDER — MEDROXYPROGESTERONE ACETATE 150 MG/ML IM SUSP
150.0000 mg | Freq: Once | INTRAMUSCULAR | Status: AC
  Administered 2015-08-30: 150 mg via INTRAMUSCULAR

## 2015-08-30 MED ORDER — MEDROXYPROGESTERONE ACETATE 150 MG/ML IM SUSP: 150.0000 mg | INTRAMUSCULAR | Status: DC

## 2015-08-30 NOTE — Progress Notes (Signed)
Patient ID: Sarah [name], female   DOB: [birth year], 23 y.o.   MRN: [id number] Pt comes to the [facility] for injection of contraception. Pt delivered on 03/01/15 @ [facility] in [place]. Pt was induced due to pre-elc, platelets had dropped to 30 and went up before delivery, pt delivered via C-section to baby girl. Patients pregnancy test was negative. Spoke with Stevphen Meuse and she stated it is ok to administer the Depo Provera 150mg  IM.

## 2015-08-30 NOTE — Patient Instructions (Signed)
Pt to follow up on July 12-26.

## 2015-11-15 ENCOUNTER — Ambulatory Visit: Payer: Medicaid Other
